# Patient Record
Sex: Male | Born: 1995 | Race: Black or African American | Hispanic: No | Marital: Single | Smoking: Never smoker
Health system: Southern US, Community
[De-identification: ages and names within clinical notes are randomized; demographics above are authoritative.]

## PROBLEM LIST (undated history)

## (undated) ENCOUNTER — Emergency Department (HOSPITAL_COMMUNITY): Discharge status: Absent without leave | Payer: Self-pay

---
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
## 2014-09-16 ENCOUNTER — Encounter (HOSPITAL_COMMUNITY): Payer: Self-pay | Admitting: Emergency Medicine

## 2014-09-16 ENCOUNTER — Emergency Department (INDEPENDENT_AMBULATORY_CARE_PROVIDER_SITE_OTHER)
Admission: EM | Admit: 2014-09-16 | Discharge: 2014-09-16 | Disposition: A | Discharge status: Home / Self Care | Payer: Medicaid Other | Source: Home / Self Care | Attending: Family Medicine | Admitting: Family Medicine

## 2014-09-16 ENCOUNTER — Emergency Department (INDEPENDENT_AMBULATORY_CARE_PROVIDER_SITE_OTHER): Payer: Medicaid Other

## 2014-09-16 DIAGNOSIS — M9390 Osteochondropathy, unspecified of unspecified site: Secondary | ICD-10-CM

## 2014-09-16 DIAGNOSIS — M93969 Osteochondropathy, unspecified, unspecified lower leg: Secondary | ICD-10-CM

## 2014-09-16 MED ORDER — IBUPROFEN 800 MG PO TABS: 800.0000 mg | ORAL_TABLET | Freq: Three times a day (TID) | ORAL | Status: DC | PRN

## 2014-09-16 NOTE — Discharge Instructions (Signed)
Osgood-Schlatter Disease °Osgood-Schlatter disease is a condition that is common in adolescents. It is most often seen during the time of growth spurts. During these times the muscles and cord-like structures that attach muscle to bone (tendons) are becoming tighter as the bones are becoming longer. This puts more strain on areas of tendon attachment. The condition is soreness (inflammation) of the lump on the upper leg below the kneecap (tibial tubercle). There is pain and tenderness in this area because of the inflammation. In addition to growth spurts, it also comes on with physical activities involving running and jumping. °This is a self-limited condition. It can get well by itself in time with conservative measures and less physical activities. It can persist up to two years. °DIAGNOSIS  °The diagnosis is made by physical examination alone. X-rays are sometimes needed to rule out other problems. °HOME CARE INSTRUCTIONS  °· Apply ice packs to the areas of pain 03-04 times a day for 15-20 minutes while awake. Do this for 2 days. °· Limit physical activities to levels that do not cause pain. °· Do stretching exercises for the legs and especially the large muscles in the front of the thigh (quadriceps). Avoid quadriceps strengthening exercises. °· Only take over-the-counter or prescription medicines for pain, discomfort, or fever as directed by your caregiver. °· Usually steroid injection or surgery is not necessary. Surgery is rarely needed if the condition persists into young adulthood. °· See your caregiver if you develop increased pain or swelling in the area, if you have pain with movement of the knee, develop a temperature, or have more pain or problems that originally brought you in for care. °Recheck with the hospital or clinic if x-rays were taken. After a radiologist (a specialist in reading x-rays) has read your x-rays, make sure there is agreement with the initial readings. Find out if more studies are  needed. Ask your caregiver how you are to learn about your radiology (x-ray) results. Remember it is your responsibility to obtain the results of your x-rays. °MAKE SURE YOU:  °· Understand these instructions. °· Will watch your condition. °· Will get help right away if you are not doing well or get worse. °Document Released: 07/23/2000 Document Revised: 10/18/2011 Document Reviewed: 07/22/2008 °ExitCare® Patient Information ©2015 [employer]. This information is not intended to replace advice given to you by your health care provider. Make sure you discuss any questions you have with your health care provider. ° ° °

## 2014-09-16 NOTE — ED Notes (Signed)
Left knee chronic pain.  Pain is worsening, no new injury

## 2014-09-16 NOTE — ED Provider Notes (Signed)
CSN: [id number]     Arrival date & time 09/16/14  1854 History   First MD Initiated Contact with Patient 09/16/14 1925     Chief Complaint  Patient presents with  . Knee Pain   (Consider location/radiation/quality/duration/timing/severity/associated sxs/prior Treatment) HPI       19 year old male with a history of Osgood-Schlatter's disease presents complaining of knee pain. He occasionally gets knee pain in both of his knees when playing basketball. Starting one week ago he began to have worsening swelling in the anterior distal portion of the left knee and increased pain. No specific injury. No swelling of the knees. No other systemic symptoms.  History reviewed. No pertinent past medical history. History reviewed. No pertinent past surgical history. No family history on file. History  Substance Use Topics  . Smoking status: Never Smoker   . Smokeless tobacco: Not on file  . Alcohol Use: No    Review of Systems  Musculoskeletal: Positive for arthralgias. Negative for myalgias and joint swelling.  All other systems reviewed and are negative.   Allergies  Review of patient's allergies indicates no known allergies.  Home Medications   Prior to Admission medications   Medication Sig Start Date End Date Taking? Authorizing Provider  ibuprofen (ADVIL,MOTRIN) 800 MG tablet Take 1 tablet (800 mg total) by mouth every 8 (eight) hours as needed. 09/16/14   Adrian BlackwaterZachary H Brynnan Rodenbaugh, PA-C   BP 112/64 mmHg  Pulse 55  Temp(Src) 98.4 F (36.9 C) (Oral)  Resp 16  SpO2 96% Physical Exam  Constitutional: He is oriented to person, place, and time. He appears well-developed and well-nourished. No distress.  HENT:  Head: Normocephalic.  Pulmonary/Chest: Effort normal. No respiratory distress.  Musculoskeletal:  The left tibial tuberosity is swollen, tender, with a mild abrasion overlying it. The knee itself is normal  Neurological: He is alert and oriented to person, place, and time. Coordination  normal.  Skin: Skin is warm and dry. No rash noted. He is not diaphoretic.  Psychiatric: He has a normal mood and affect. Judgment normal.  Nursing note and vitals reviewed.   ED Course  Procedures (including critical care time) Labs Review Labs Reviewed - No data to display  Imaging Review Dg Knee Complete 4 Views Left  09/16/2014   CLINICAL DATA:  Knee pain.  Pain inferior to the patella.  EXAM: LEFT KNEE - COMPLETE 4+ VIEW  COMPARISON:  None.  FINDINGS: Anatomic aligned of the LEFT knee. There is no fracture. No effusion. Joint spaces are preserved. Fragmentation of the tibial tuberosity is present, compatible with Osgood-Schlatter disease.  IMPRESSION: No acute osseous abnormality.  Osgood-Schlatter disease.   Electronically Signed   By: Andreas NewportGeoffrey  Lamke M.D.   On: 09/16/2014 19:50     MDM   1. Osteochondritis of tibial tuberosity    He has some irritation in the area that was previously affected by the Osgood-Schlatter disease. Advised ice, ibuprofen makes a compression sleeve as well. He has been given a referral for sports medicine center for follow-up if he continues to have pain   Meds ordered this encounter  Medications  . ibuprofen (ADVIL,MOTRIN) 800 MG tablet    Sig: Take 1 tablet (800 mg total) by mouth every 8 (eight) hours as needed.    Dispense:  60 tablet    Refill:  5       Graylon GoodZachary H Aryanah Enslow, PA-C 09/16/14 2000

## 2014-09-26 ENCOUNTER — Encounter (HOSPITAL_COMMUNITY): Payer: Self-pay | Admitting: Emergency Medicine

## 2014-09-26 ENCOUNTER — Other Ambulatory Visit (HOSPITAL_COMMUNITY)
Admission: RE | Admit: 2014-09-26 | Discharge: 2014-09-26 | Disposition: A | Discharge status: Home / Self Care | Payer: Medicaid Other | Source: Ambulatory Visit | Attending: Emergency Medicine | Admitting: Emergency Medicine

## 2014-09-26 ENCOUNTER — Emergency Department (INDEPENDENT_AMBULATORY_CARE_PROVIDER_SITE_OTHER)
Admission: EM | Admit: 2014-09-26 | Discharge: 2014-09-26 | Disposition: A | Discharge status: Home / Self Care | Payer: Medicaid Other | Source: Home / Self Care | Attending: Emergency Medicine | Admitting: Emergency Medicine

## 2014-09-26 DIAGNOSIS — Z113 Encounter for screening for infections with a predominantly sexual mode of transmission: Secondary | ICD-10-CM | POA: Insufficient documentation

## 2014-09-26 DIAGNOSIS — Z202 Contact with and (suspected) exposure to infections with a predominantly sexual mode of transmission: Secondary | ICD-10-CM

## 2014-09-26 LAB — POCT PREGNANCY, URINE: PREG TEST UR: POSITIVE — AB

## 2014-09-26 NOTE — ED Provider Notes (Signed)
CSN: [id number]     Arrival date & time 09/26/14  [id number] History   First MD Initiated Contact with Patient 09/26/14 1013     Chief Complaint  Patient presents with  . Exposure to STD   (Consider location/radiation/quality/duration/timing/severity/associated sxs/prior Treatment) HPI Comments: Patient states he was told by a sex partner that she recently tested positive for herpes. He reports no symptoms but comes to our clinic requesting STI testing. Reports himself to be otherwise healthy.  Patient is a 19 y.o. male presenting with STD exposure. The history is provided by the patient.  Exposure to STD    History reviewed. No pertinent past medical history. History reviewed. No pertinent past surgical history. History reviewed. No pertinent family history. History  Substance Use Topics  . Smoking status: Never Smoker   . Smokeless tobacco: Not on file  . Alcohol Use: No    Review of Systems  All other systems reviewed and are negative.   Allergies  Review of patient's allergies indicates no known allergies.  Home Medications   Prior to Admission medications   Medication Sig Start Date End Date Taking? Authorizing Provider  ibuprofen (ADVIL,MOTRIN) 800 MG tablet Take 1 tablet (800 mg total) by mouth every 8 (eight) hours as needed. 09/16/14   Adrian BlackwaterZachary H Baker, PA-C   BP 139/83 mmHg  Pulse 70  Temp(Src) 98.6 F (37 C) (Oral)  Resp 14  SpO2 100% Physical Exam  Constitutional: He is oriented to person, place, and time. He appears well-developed and well-nourished. No distress.  HENT:  Head: Normocephalic and atraumatic.  Eyes: Conjunctivae are normal.  Cardiovascular: Normal rate.   Pulmonary/Chest: Effort normal.  Abdominal: Soft. Bowel sounds are normal. He exhibits no distension. There is no tenderness.  Genitourinary: Testes normal and penis normal. Circumcised.  Musculoskeletal: Normal range of motion.  Neurological: He is alert and oriented to person, place, and  time.  Skin: Skin is warm and dry. No rash noted. No erythema.  Psychiatric: He has a normal mood and affect. His behavior is normal.  Nursing note and vitals reviewed.   ED Course  Procedures (including critical care time) Labs Review Labs Reviewed  POCT PREGNANCY, URINE - Abnormal; Notable for the following:    Preg Test, Ur POSITIVE (*)    All other components within normal limits  RPR  HIV ANTIBODY (ROUTINE TESTING)  HSV 2 ANTIBODY, IGG  HSV 1 ANTIBODY, IGG  URINE CYTOLOGY ANCILLARY ONLY    Imaging Review No results found.   MDM   1. Possible exposure to STD   It should be noted that a positive pregnancy test was erroneously recorded on this patient's chart and efforts are being made to correct this. Urine cytology sent for gonorrhea, chlamydia and trichomonas. Bloodwork sent for syphilis, HIV, HSV1 and HSV2.  Patient declined empiric treatment for gonorrhea and syphilis.  Advised regarding safe sex practices. Patient notified that he will be notified by phone of any positive results. [facility]      Ria ClockJennifer Lee H Katlen Seyer, GeorgiaPA 09/26/14 1116

## 2014-09-26 NOTE — ED Notes (Signed)
Pt states that he was told by his partner that he was exposed to STD about a month ago. He is here for STD testing

## 2014-09-26 NOTE — Discharge Instructions (Signed)
Sexually Transmitted Disease °A sexually transmitted disease (STD) is a disease or infection that may be passed (transmitted) from person to person, usually during sexual activity. This may happen by way of saliva, semen, blood, vaginal mucus, or urine. Common STDs include:  °· Gonorrhea.   °· Chlamydia.   °· Syphilis.   °· HIV and AIDS.   °· Genital herpes.   °· Hepatitis B and C.   °· Trichomonas.   °· Human papillomavirus (HPV).   °· Pubic lice.   °· Scabies. °· Mites. °· Bacterial vaginosis. °WHAT ARE CAUSES OF STDs? °An STD may be caused by bacteria, a virus, or parasites. STDs are often transmitted during sexual activity if one person is infected. However, they may also be transmitted through nonsexual means. STDs may be transmitted after:  °· Sexual intercourse with an infected person.   °· Sharing sex toys with an infected person.   °· Sharing needles with an infected person or using unclean piercing or tattoo needles. °· Having intimate contact with the genitals, mouth, or rectal areas of an infected person.   °· Exposure to infected fluids during birth. °WHAT ARE THE SIGNS AND SYMPTOMS OF STDs? °Different STDs have different symptoms. Some people may not have any symptoms. If symptoms are present, they may include:  °· Painful or bloody urination.   °· Pain in the pelvis, abdomen, vagina, anus, throat, or eyes.   °· A skin rash, itching, or irritation. °· Growths, ulcerations, blisters, or sores in the genital and anal areas. °· Abnormal vaginal discharge with or without bad odor.   °· Penile discharge in men.   °· Fever.   °· Pain or bleeding during sexual intercourse.   °· Swollen glands in the groin area.   °· Yellow skin and eyes (jaundice). This is seen with hepatitis.   °· Swollen testicles. °· Infertility. °· Sores and blisters in the mouth. °HOW ARE STDs DIAGNOSED? °To make a diagnosis, your health care provider may:  °· Take a medical history.   °· Perform a physical exam.   °· Take a sample of  any discharge to examine. °· Swab the throat, cervix, opening to the penis, rectum, or vagina for testing. °· Test a sample of your first morning urine.   °· Perform blood tests.   °· Perform a Pap test, if this applies.   °· Perform a colposcopy.   °· Perform a laparoscopy.   °HOW ARE STDs TREATED? ° Treatment depends on the STD. Some STDs may be treated but not cured.  °· Chlamydia, gonorrhea, trichomonas, and syphilis can be cured with antibiotic medicine.   °· Genital herpes, hepatitis, and HIV can be treated, but not cured, with prescribed medicines. The medicines lessen symptoms.   °· Genital warts from HPV can be treated with medicine or by freezing, burning (electrocautery), or surgery. Warts may come back.   °· HPV cannot be cured with medicine or surgery. However, abnormal areas may be removed from the cervix, vagina, or vulva.   °· If your diagnosis is confirmed, your recent sexual partners need treatment. This is true even if they are symptom-free or have a negative culture or evaluation. They should not have sex until their health care providers say it is okay. °HOW CAN I REDUCE MY RISK OF GETTING AN STD? °Take these steps to reduce your risk of getting an STD: °· Use latex condoms, dental dams, and water-soluble lubricants during sexual activity. Do not use petroleum jelly or oils. °· Avoid having multiple sex partners. °· Do not have sex with someone who has other sex partners. °· Do not have sex with anyone you do not know or who is at   high risk for an STD. °· Avoid risky sex practices that can break your skin. °· Do not have sex if you have open sores on your mouth or skin. °· Avoid drinking too much alcohol or taking illegal drugs. Alcohol and drugs can affect your judgment and put you in a vulnerable position. °· Avoid engaging in oral and anal sex acts. °· Get vaccinated for HPV and hepatitis. If you have not received these vaccines in the past, talk to your health care provider about whether one  or both might be right for you.   °· If you are at risk of being infected with HIV, it is recommended that you take a prescription medicine daily to prevent HIV infection. This is called pre-exposure prophylaxis (PrEP). You are considered at risk if: °¨ You are a man who has sex with other men (MSM). °¨ You are a heterosexual man or woman and are sexually active with more than one partner. °¨ You take drugs by injection. °¨ You are sexually active with a partner who has HIV. °· Talk with your health care provider about whether you are at high risk of being infected with HIV. If you choose to begin PrEP, you should first be tested for HIV. You should then be tested every 3 months for as long as you are taking PrEP.   °WHAT SHOULD I DO IF I THINK I HAVE AN STD? °· See your health care provider.   °· Tell your sexual partner(s). They should be tested and treated for any STDs. °· Do not have sex until your health care provider says it is okay.  °WHEN SHOULD I GET IMMEDIATE MEDICAL CARE? °Contact your health care provider right away if:  °· You have severe abdominal pain. °· You are a man and notice swelling or pain in your testicles. °· You are a woman and notice swelling or pain in your vagina. °Document Released: 10/16/2002 Document Revised: 07/31/2013 Document Reviewed: 02/13/2013 °ExitCare® Patient Information ©2015 [employer]. This information is not intended to replace advice given to you by your health care provider. Make sure you discuss any questions you have with your health care provider. ° °Safe Sex °Safe sex is about reducing the risk of giving or getting a sexually transmitted disease (STD). STDs are spread through sexual contact involving the genitals, mouth, or rectum. Some STDs can be cured and others cannot. Safe sex can also prevent unintended pregnancies.  °WHAT ARE SOME SAFE SEX PRACTICES? °· Limit your sexual activity to only one partner who is having sex with only you. °· Talk to your partner  about his or her past partners, past STDs, and drug use. °· Use a condom every time you have sexual intercourse. This includes vaginal, oral, and anal sexual activity. Both females and males should wear condoms during oral sex. Only use latex or polyurethane condoms and water-based lubricants. Using petroleum-based lubricants or oils to lubricate a condom will weaken the condom and increase the chance that it will break. The condom should be in place from the beginning to the end of sexual activity. Wearing a condom reduces, but does not completely eliminate, your risk of getting or giving an STD. STDs can be spread by contact with infected body fluids and skin. °· Get vaccinated for hepatitis B and HPV. °· Avoid alcohol and recreational drugs, which can affect your judgment. You may forget to use a condom or participate in high-risk sex. °· For females, avoid douching after sexual intercourse. Douching can spread an infection   farther into the reproductive tract. °· Check your body for signs of sores, blisters, rashes, or unusual discharge. See your health care provider if you notice any of these signs. °· Avoid sexual contact if you have symptoms of an infection or are being treated for an STD. If you or your partner has herpes, avoid sexual contact when blisters are present. Use condoms at all other times. °· If you are at risk of being infected with HIV, it is recommended that you take a prescription medicine daily to prevent HIV infection. This is called pre-exposure prophylaxis (PrEP). You are considered at risk if: °¨ You are a man who has sex with other men (MSM). °¨ You are a heterosexual man or woman who is sexually active with more than one partner. °¨ You take drugs by injection. °¨ You are sexually active with a partner who has HIV. °· Talk with your health care provider about whether you are at high risk of being infected with HIV. If you choose to begin PrEP, you should first be tested for HIV. You  should then be tested every 3 months for as long as you are taking PrEP. °· See your health care provider for regular screenings, exams, and tests for other STDs. Before having sex with a new partner, each of you should be screened for STDs and should talk about the results with each other. °WHAT ARE THE BENEFITS OF SAFE SEX?  °· There is less chance of getting or giving an STD. °· You can prevent unwanted or unintended pregnancies. °· By discussing safe sex concerns with your partner, you may increase feelings of intimacy, comfort, trust, and honesty between the two of you. °Document Released: 09/02/2004 Document Revised: 12/10/2013 Document Reviewed: 01/17/2012 °ExitCare® Patient Information ©2015 [employer]. This information is not intended to replace advice given to you by your health care provider. Make sure you discuss any questions you have with your health care provider. ° °

## 2014-09-27 LAB — URINE CYTOLOGY ANCILLARY ONLY
Chlamydia: NEGATIVE
Neisseria Gonorrhea: NEGATIVE
Trichomonas: NEGATIVE

## 2014-09-27 LAB — HSV 2 ANTIBODY, IGG: HSV 2 GLYCOPROTEIN G AB, IGG: 22.7 {index} — AB (ref 0.00–0.90)

## 2014-09-27 LAB — HIV ANTIBODY (ROUTINE TESTING W REFLEX): HIV SCREEN 4TH GENERATION: NONREACTIVE

## 2014-09-27 LAB — HSV 1 ANTIBODY, IGG

## 2014-09-27 LAB — RPR: RPR Ser Ql: NONREACTIVE

## 2014-09-30 NOTE — ED Notes (Signed)
Patient here for lab report discussion. 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 As he is not having an active out break, patient has been advised to follow up w his regular MD for preventative care. Advised to follow up w [facility] for HIV testing in 6 months again in 1 yr

## 2015-04-10 ENCOUNTER — Encounter (HOSPITAL_COMMUNITY): Payer: Self-pay | Admitting: Emergency Medicine

## 2015-04-10 ENCOUNTER — Emergency Department (HOSPITAL_COMMUNITY)
Admission: EM | Admit: 2015-04-10 | Discharge: 2015-04-10 | Disposition: A | Discharge status: Home / Self Care | Payer: Medicaid Other | Attending: Emergency Medicine | Admitting: Emergency Medicine

## 2015-04-10 ENCOUNTER — Emergency Department (HOSPITAL_COMMUNITY): Payer: Medicaid Other

## 2015-04-10 DIAGNOSIS — S8992XA Unspecified injury of left lower leg, initial encounter: Secondary | ICD-10-CM

## 2015-04-10 DIAGNOSIS — Y998 Other external cause status: Secondary | ICD-10-CM | POA: Insufficient documentation

## 2015-04-10 DIAGNOSIS — Y9289 Other specified places as the place of occurrence of the external cause: Secondary | ICD-10-CM | POA: Insufficient documentation

## 2015-04-10 DIAGNOSIS — W1839XA Other fall on same level, initial encounter: Secondary | ICD-10-CM | POA: Insufficient documentation

## 2015-04-10 DIAGNOSIS — Y9367 Activity, basketball: Secondary | ICD-10-CM | POA: Insufficient documentation

## 2015-04-10 MED ORDER — NAPROXEN 375 MG PO TABS: 375.0000 mg | ORAL_TABLET | Freq: Two times a day (BID) | ORAL | Status: AC

## 2015-04-10 NOTE — ED Provider Notes (Signed)
CSN: [id number]     Arrival date & time 04/10/15  2116 History   First MD Initiated Contact with Patient 04/10/15 2130     Chief Complaint  Patient presents with  . Knee Injury     (Consider location/radiation/quality/duration/timing/severity/associated sxs/prior Treatment) HPI Comments: Patient presents to the emergency department with chief complaint of left knee pain. Patient states that he hyperextended his left knee while playing basketball yesterday. States that when he awoke this morning he had increased pain and difficulty with ambulation. He reports some clicking and popping sensation. States that he thought he felt a pop after he initially hurt his knee. He has not taken anything to alleviate the symptoms. It is aggravated with movement. The pain does not radiate.  The history is provided by the patient. No language interpreter was used.    History reviewed. No pertinent past medical history. History reviewed. No pertinent past surgical history. No family history on file. Social History  Substance Use Topics  . Smoking status: Never Smoker   . Smokeless tobacco: None  . Alcohol Use: No    Review of Systems  Constitutional: Negative for fever and chills.  Respiratory: Negative for shortness of breath.   Cardiovascular: Negative for chest pain.  Gastrointestinal: Negative for nausea, vomiting, diarrhea and constipation.  Genitourinary: Negative for dysuria.  Musculoskeletal: Positive for arthralgias and gait problem.      Allergies  Review of patient's allergies indicates no known allergies.  Home Medications   Prior to Admission medications   Medication Sig Start Date End Date Taking? Authorizing Provider  naproxen (NAPROSYN) 375 MG tablet Take 1 tablet (375 mg total) by mouth 2 (two) times daily. 04/10/15   Roxy Horseman, PA-C   BP 147/93 mmHg  Pulse 55  Temp(Src) 98.8 F (37.1 C) (Oral)  Resp 14  SpO2 100% Physical Exam  Constitutional: He is oriented to  person, place, and time. He appears well-developed and well-nourished.  HENT:  Head: Normocephalic and atraumatic.  Eyes: Conjunctivae and EOM are normal.  Neck: Normal range of motion.  Cardiovascular: Normal rate and intact distal pulses.   Intact distal pulses with brisk capillary refill  Pulmonary/Chest: Effort normal.  Abdominal: He exhibits no distension.  Musculoskeletal: Normal range of motion.  Left knee range of motion and strength 5/5, patient ambulates with an antalgic gait, there is no bony abnormality or deformity, there is no obvious swelling or joint effusion  Neurological: He is alert and oriented to person, place, and time.  Sensation intact  Skin: Skin is dry.  Skin is intact, there is no erythema, or cellulitis  Psychiatric: He has a normal mood and affect. His behavior is normal. Judgment and thought content normal.  Nursing note and vitals reviewed.   ED Course  Procedures (including critical care time)  Imaging Review Dg Knee Complete 4 Views Left  04/10/2015   CLINICAL DATA:  Status post fall on fully flexed left knee, with worsening left knee pain and tightness. Initial encounter.  EXAM: LEFT KNEE - COMPLETE 4+ VIEW  COMPARISON:  Left knee radiographs performed 09/16/2014  FINDINGS: There is no evidence of fracture or dislocation. The joint spaces are preserved. No significant degenerative change is seen; the patellofemoral joint is grossly unremarkable in appearance. An accessory ossicle is noted at the distal patellar tendon.  No significant joint effusion is seen. The visualized soft tissues are normal in appearance.  IMPRESSION: 1. No evidence of fracture or dislocation. 2. Accessory ossicle noted at the distal patellar  tendon.   Electronically Signed   By: Roanna Raider M.D.   On: 04/10/2015 22:48   I have personally reviewed and evaluated these images and lab results as part of my medical decision-making.   MDM   Final diagnoses:  Knee injury, left,  initial encounter    Patient with left knee injury, concern for soft tissue injury. Will recommend orthopedic follow-up. Will give the patient a knee immobilizer, crutches, and start him on an anti-inflammatory. Return cautions given. Patient is stable and ready for discharge. He will follow-up with orthopedics as directed.    Roxy Horseman, PA-C 04/10/15 1610  Gwyneth Sprout, MD 04/10/15 [phone]

## 2015-04-10 NOTE — ED Notes (Signed)
Pt. injured his left knee while playing basketball yesterday , ambulatory , pain increases with movement .

## 2015-04-10 NOTE — Discharge Instructions (Signed)

## 2016-05-11 IMAGING — CR DG KNEE COMPLETE 4+V*L*
4 series · 4 of 4 positions shown · non-contrast
Comparison: Left knee radiographs performed 09/16/2014

CLINICAL DATA: Status post fall on fully flexed left knee, with
worsening left knee pain and tightness. Initial encounter.

EXAM:
LEFT KNEE - COMPLETE 4+ VIEW

[knee ap]
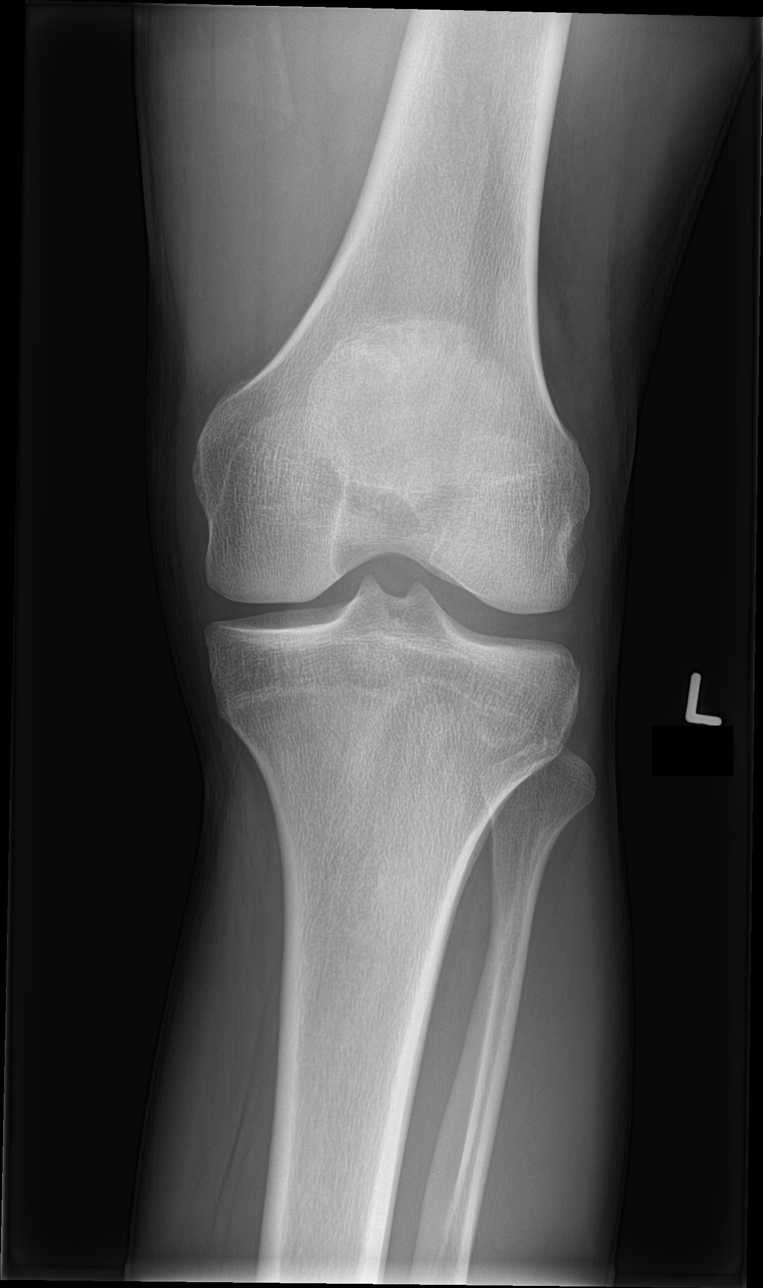

[knee lat]
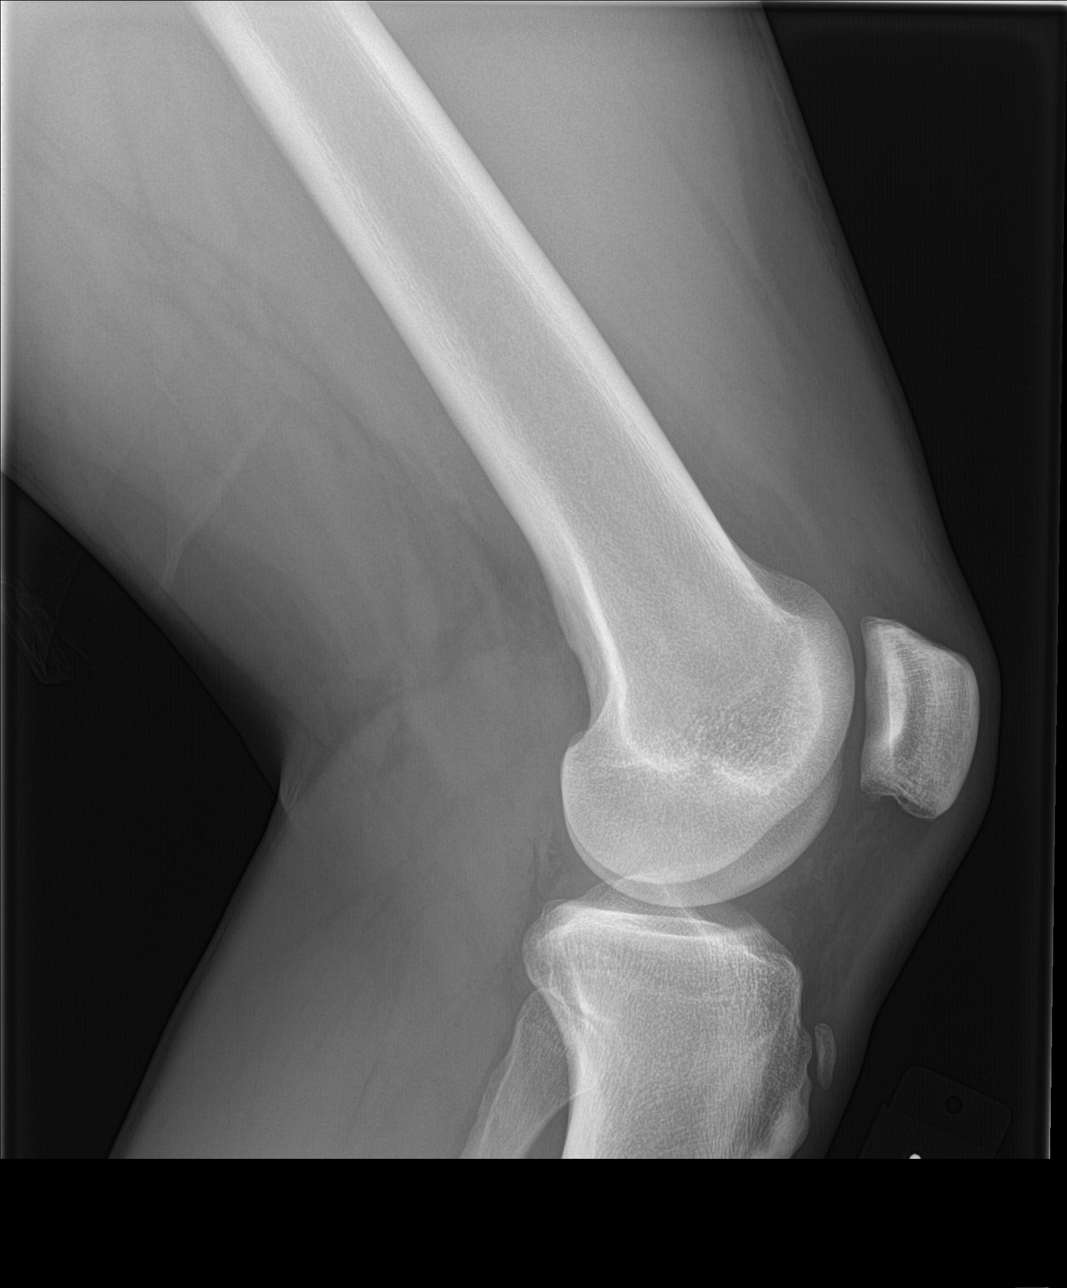

[knee obl (1 of 2)]
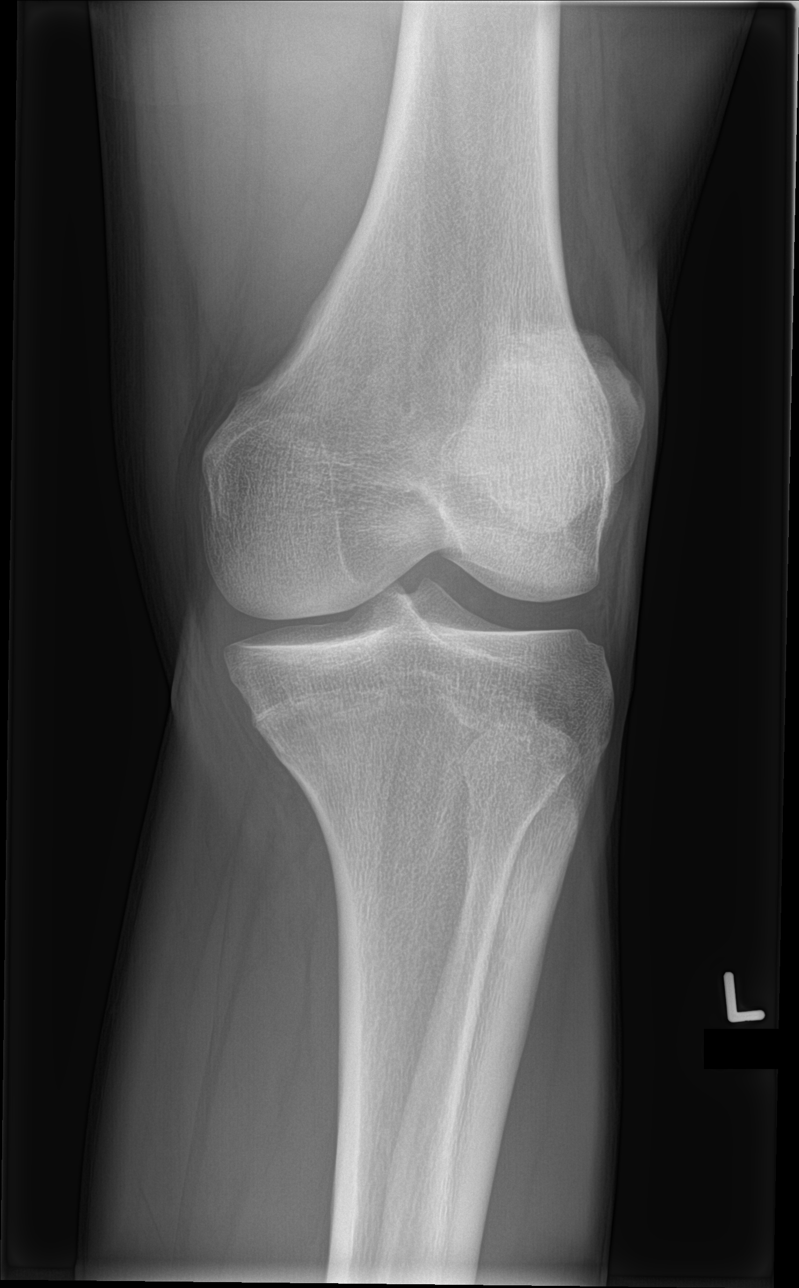

[knee obl (2 of 2)]
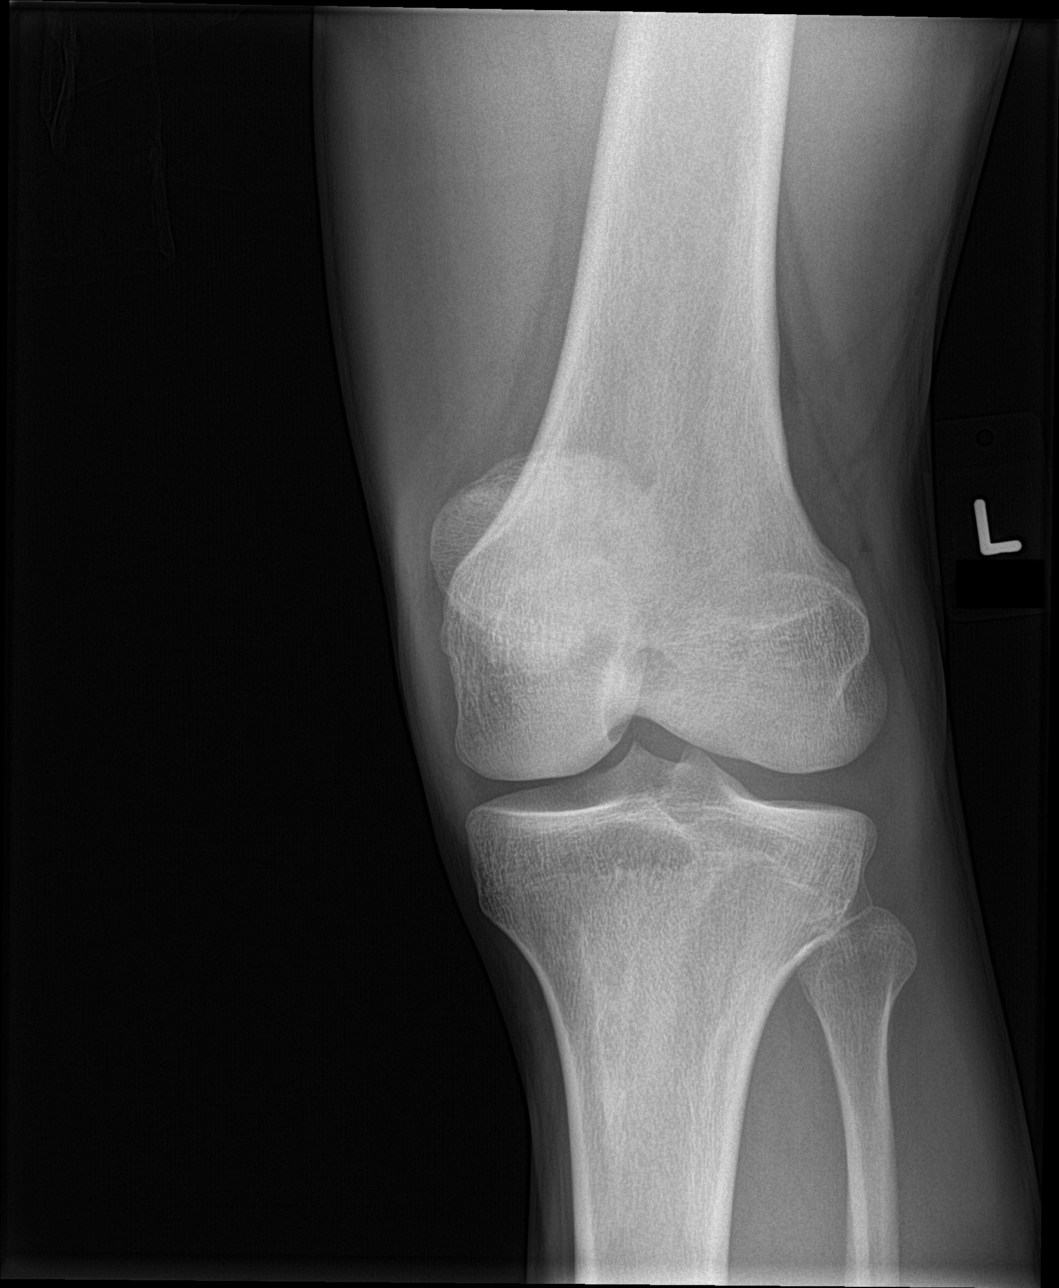

[4 of 4 positions shown; findings below may reference images not displayed]

FINDINGS: There is no evidence of fracture or dislocation. The joint spaces
are preserved. No significant degenerative change is seen; the
patellofemoral joint is grossly unremarkable in appearance. An
accessory ossicle is noted at the distal patellar tendon.

No significant joint effusion is seen. The visualized soft tissues
are normal in appearance.
IMPRESSION: 1. No evidence of fracture or dislocation.
2. Accessory ossicle noted at the distal patellar tendon.
